# Patient Record
Sex: Male | Born: 1980 | Hispanic: Yes | Marital: Married | Smoking: Former smoker
Health system: Southern US, Community
[De-identification: ages and names within clinical notes are randomized; demographics above are authoritative.]

## PROBLEM LIST (undated history)

## (undated) HISTORY — PX: LEG SURGERY: SHX1003

## (undated) HISTORY — PX: APPENDECTOMY: SHX54

---

## 2005-02-01 ENCOUNTER — Emergency Department: Payer: Self-pay | Admitting: Emergency Medicine

## 2005-12-11 ENCOUNTER — Emergency Department: Payer: Self-pay | Admitting: Emergency Medicine

## 2005-12-25 ENCOUNTER — Emergency Department: Payer: Self-pay | Admitting: Emergency Medicine

## 2009-07-07 ENCOUNTER — Inpatient Hospital Stay: Payer: Self-pay | Admitting: Surgery

## 2009-09-19 ENCOUNTER — Emergency Department: Payer: Self-pay

## 2011-11-03 ENCOUNTER — Ambulatory Visit: Payer: Self-pay | Admitting: Family Medicine

## 2011-11-03 ENCOUNTER — Ambulatory Visit: Payer: Self-pay

## 2011-11-03 VITALS — BP 118/77 | HR 67 | Temp 98.0°F | Resp 16

## 2011-11-03 DIAGNOSIS — M79672 Pain in left foot: Secondary | ICD-10-CM

## 2011-11-03 DIAGNOSIS — S92309A Fracture of unspecified metatarsal bone(s), unspecified foot, initial encounter for closed fracture: Secondary | ICD-10-CM

## 2011-11-03 DIAGNOSIS — M79609 Pain in unspecified limb: Secondary | ICD-10-CM

## 2011-11-03 MED ORDER — HYDROCODONE-ACETAMINOPHEN 7.5-750 MG PO TABS: 1.0000 | ORAL_TABLET | Freq: Three times a day (TID) | ORAL | Status: AC | PRN

## 2011-11-03 NOTE — Progress Notes (Signed)
  Subjective:    Patient ID: Ames Coupe, male    DOB: 1980-11-13, 31 y.o.   MRN: [id number]  HPI 31 yo male jumped off porch and landed on both feet, but under left foot was small lump of some nature - uneven ground - and bent foot up.  Immediate pain and swelling.  Has not been able to bear weight on it.    Review of Systems Negative except as per HPI     Objective:   Physical Exam  Left foot tender across top of midfoot.  Pain at bilateral malleoli.  Swelling.  Decreased ROM.  Warm, neurovascularly intact  [facility] Primary radiology reading by Dr. Georgiana Shore: fracture of base of 2nd, 3rd, and 4th metatarsals with fx of adjoining tarsal.        Assessment & Plan:  Foot pain Fractures  nonweight bearing, boot, crutches, to ortho in AM

## 2015-12-04 ENCOUNTER — Emergency Department (HOSPITAL_BASED_OUTPATIENT_CLINIC_OR_DEPARTMENT_OTHER): Payer: Self-pay

## 2015-12-04 ENCOUNTER — Encounter (HOSPITAL_BASED_OUTPATIENT_CLINIC_OR_DEPARTMENT_OTHER): Payer: Self-pay | Admitting: *Deleted

## 2015-12-04 ENCOUNTER — Emergency Department (HOSPITAL_BASED_OUTPATIENT_CLINIC_OR_DEPARTMENT_OTHER)
Admission: EM | Admit: 2015-12-04 | Discharge: 2015-12-04 | Disposition: A | Discharge status: Home / Self Care | Payer: Self-pay | Attending: Emergency Medicine | Admitting: Emergency Medicine

## 2015-12-04 DIAGNOSIS — Y9289 Other specified places as the place of occurrence of the external cause: Secondary | ICD-10-CM | POA: Insufficient documentation

## 2015-12-04 DIAGNOSIS — Y9389 Activity, other specified: Secondary | ICD-10-CM | POA: Insufficient documentation

## 2015-12-04 DIAGNOSIS — W450XXA Nail entering through skin, initial encounter: Secondary | ICD-10-CM | POA: Insufficient documentation

## 2015-12-04 DIAGNOSIS — Z23 Encounter for immunization: Secondary | ICD-10-CM | POA: Insufficient documentation

## 2015-12-04 DIAGNOSIS — F17210 Nicotine dependence, cigarettes, uncomplicated: Secondary | ICD-10-CM | POA: Insufficient documentation

## 2015-12-04 DIAGNOSIS — T148XXA Other injury of unspecified body region, initial encounter: Secondary | ICD-10-CM

## 2015-12-04 DIAGNOSIS — Y990 Civilian activity done for income or pay: Secondary | ICD-10-CM | POA: Insufficient documentation

## 2015-12-04 DIAGNOSIS — S61431A Puncture wound without foreign body of right hand, initial encounter: Secondary | ICD-10-CM | POA: Insufficient documentation

## 2015-12-04 MED ORDER — TETANUS-DIPHTH-ACELL PERTUSSIS 5-2.5-18.5 LF-MCG/0.5 IM SUSP
0.5000 mL | Freq: Once | INTRAMUSCULAR | Status: AC
  Administered 2015-12-04: 0.5 mL via INTRAMUSCULAR
  Filled 2015-12-04: qty 0.5

## 2015-12-04 MED ORDER — AMOXICILLIN-POT CLAVULANATE 875-125 MG PO TABS: 1.0000 | ORAL_TABLET | Freq: Two times a day (BID) | ORAL | Status: DC

## 2015-12-04 MED ORDER — AMOXICILLIN-POT CLAVULANATE 875-125 MG PO TABS
1.0000 | ORAL_TABLET | Freq: Once | ORAL | Status: AC
  Administered 2015-12-04: 1 via ORAL
  Filled 2015-12-04: qty 1

## 2015-12-04 NOTE — ED Provider Notes (Signed)
CSN: [id number]     Arrival date & time 12/04/15  1908 History  By signing my name below, I, Jolayne Panther, attest that this documentation has been prepared under the direction and in the presence of Dorie Rank, MD. Electronically Signed: Jolayne Panther, Scribe. 12/04/2015. 8:25 PM.    Chief Complaint  Patient presents with  . Hand Injury   The history is provided by the patient. No language interpreter was used.    HPI Comments: Edmundo Lonsdale is a 35 y.o. male who presents to the Emergency Department complaining of sudden onset of a wound to his right hand with the site of entry on the dorsal aspect and the exit wound on the palmar aspect of his hand, after pt shot a framing nail through his right hand about three hours ago. Pt states that the nail came out easily when taken out immediately following the injury Pt's tetanus is not UTD. Bleeding is currently controlled.  History reviewed. No pertinent past medical history. Past Surgical History  Procedure Laterality Date  . Appendectomy    . Leg surgery     No family history on file. Social History  Substance Use Topics  . Smoking status: Current Every Day Smoker    Types: Cigarettes  . Smokeless tobacco: None  . Alcohol Use: Yes    Review of Systems  Skin: Positive for wound.  A complete 10 system review of systems was obtained and all systems are negative except as noted in the HPI and PMH.   Allergies  Review of patient's allergies indicates no known allergies.  Home Medications   Prior to Admission medications   Medication Sig Start Date End Date Taking? Authorizing Provider  amoxicillin-clavulanate (AUGMENTIN) 875-125 MG tablet Take 1 tablet by mouth 2 (two) times daily. 12/04/15   Dorie Rank, MD   BP 131/84 mmHg  Pulse 84  Temp(Src) 97.8 F (36.6 C) (Oral)  Resp 20  Ht 5\' 2"  (1.575 m)  Wt 63.504 kg  BMI 25.60 kg/m2  SpO2 100% Physical Exam  Constitutional: He appears well-developed and well-nourished.  No distress.  HENT:  Head: Normocephalic and atraumatic.  Right Ear: External ear normal.  Left Ear: External ear normal.  Eyes: Conjunctivae are normal. Right eye exhibits no discharge. Left eye exhibits no discharge. No scleral icterus.  Neck: Neck supple. No tracheal deviation present.  Cardiovascular: Normal rate.   Pulmonary/Chest: Effort normal. No stridor. No respiratory distress.  Musculoskeletal: He exhibits no edema.  Puncture wound on dorsal aspect around the fourth MCP joint and another wound on the palmar aspect more in the web space between the thumb and index finger.  Neurological: He is alert. Cranial nerve deficit: no gross deficits.  Skin: Skin is warm and dry. No rash noted.  Psychiatric: He has a normal mood and affect.  Nursing note and vitals reviewed.  ED Course  Procedures  DIAGNOSTIC STUDIES:    Oxygen Saturation is 100% on RA, normal by my interpretation.   COORDINATION OF CARE:  8:18 PM Will order x-ray and tetanus shot. Discussed treatment plan with pt at bedside and pt agreed to plan.   Imaging Review Dg Hand Complete Right  12/04/2015  CLINICAL DATA:  Puncture with a nail. EXAM: RIGHT HAND - COMPLETE 3+ VIEW COMPARISON:  None. FINDINGS: There is no evidence of fracture or dislocation. There is no evidence of arthropathy or other focal bone abnormality. Soft tissues are unremarkable. IMPRESSION: Negative. Electronically Signed   By: Nolon Nations  M.D.   On: 12/04/2015 20:05   I have personally reviewed and evaluated these images as part of my medical decision-making.  MDM   Final diagnoses:  Puncture wound   Wound cleaned with normal saline.  No fx noted on xray.  Will dc home with oral abx.  Tetanus updated  I personally performed the services described in this documentation, which was scribed in my presence.  The recorded information has been reviewed and is accurate.      Dorie Rank, MD 12/04/15 2220

## 2015-12-04 NOTE — ED Notes (Signed)
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 Dried blood noted. Bleeding controlled. Entrance wound to the back of his hand and exit on the palm side of his hand.

## 2015-12-04 NOTE — Discharge Instructions (Signed)
Puncture Wound A puncture wound is an injury that is caused by a sharp, thin object that goes through (penetrates) your skin. Usually, a puncture wound does not leave a large opening in your skin, so it may not bleed a lot. However, when you get a puncture wound, dirt or other materials (foreign bodies) can be forced into your wound and break off inside. This increases the chance of infection, such as tetanus. CAUSES Puncture wounds are caused by any sharp, thin object that goes through your skin, such as:  Animal teeth, as with an animal bite.  Sharp, pointed objects, such as nails, splinters of glass, fishhooks, and needles. SYMPTOMS Symptoms of a puncture wound include:  Pain.  Bleeding.  Swelling.  Bruising.  Fluid leaking from the wound.  Numbness, tingling, or loss of function. DIAGNOSIS This condition is diagnosed with a medical history and physical exam. Your wound will be checked to see if it contains any foreign bodies. You may also have X-rays or other imaging tests. TREATMENT Treatment for a puncture wound depends on how serious the wound is. It also depends on whether the wound contains any foreign bodies. Treatment for all types of puncture wounds usually starts with:  Controlling the bleeding.  Washing out the wound with a germ-free (sterile) salt-water solution.  Checking the wound for foreign bodies. Treatment may also include:  Having the wound opened surgically to remove a foreign object.  Closing the wound with stitches (sutures) if it continues to bleed.  Covering the wound with antibiotic ointments and a bandage (dressing).  Receiving a tetanus shot.  Receiving a rabies vaccine. HOME CARE INSTRUCTIONS Medicines  Take or apply over-the-counter and prescription medicines only as told by your health care provider.  If you were prescribed an antibiotic, take or apply it as told by your health care provider. Do not stop using the antibiotic even if  your condition improves. Wound Care  There are many ways to close and cover a wound. For example, a wound can be covered with sutures, skin glue, or adhesive strips. Follow instructions from your health care provider about:  How to take care of your wound.  When and how you should change your dressing.  When you should remove your dressing.  Removing whatever was used to close your wound.  Keep the dressing dry as told by your health care provider. Do not take baths, swim, use a hot tub, or do anything that would put your wound underwater until your health care provider approves.  Clean the wound as told by your health care provider.  Do not scratch or pick at the wound.  Check your wound every day for signs of infection. Watch for:  Redness, swelling, or pain.  Fluid, blood, or pus. General Instructions  Raise (elevate) the injured area above the level of your heart while you are sitting or lying down.  If your puncture wound is in your foot, ask your health care provider if you need to avoid putting weight on your foot and for how long.  Keep all follow-up visits as told by your health care provider. This is important. SEEK MEDICAL CARE IF:  You received a tetanus shot and you have swelling, severe pain, redness, or bleeding at the injection site.  You have a fever.  Your sutures come out.  You notice a bad smell coming from your wound or your dressing.  You notice something coming out of your wound, such as wood or glass.  Your   pain is not controlled with medicine.  You have increased redness, swelling, or pain at the site of your wound.  You have fluid, blood, or pus coming from your wound.  You notice a change in the color of your skin near your wound.  You need to change the dressing frequently due to fluid, blood, or pus draining from your wound.  You develop a new rash.  You develop numbness around your wound. SEEK IMMEDIATE MEDICAL CARE IF:  You  develop severe swelling around your wound.  Your pain suddenly increases and is severe.  You develop painful skin lumps.  You have a red streak going away from your wound.  The wound is on your hand or foot and you cannot properly move a finger or toe.  The wound is on your hand or foot and you notice that your fingers or toes look pale or bluish.   This information is not intended to replace advice given to you by your health care provider. Make sure you discuss any questions you have with your health care provider.   Document Released: 06/23/2005 Document Revised: 06/04/2015 Document Reviewed: 11/06/2014 Elsevier Interactive Patient Education 2016 [employer]  

## 2017-05-01 ENCOUNTER — Emergency Department
Admission: EM | Admit: 2017-05-01 | Discharge: 2017-05-02 | Disposition: A | Discharge status: Home / Self Care | Payer: Self-pay | Attending: Emergency Medicine | Admitting: Emergency Medicine

## 2017-05-01 DIAGNOSIS — Z79899 Other long term (current) drug therapy: Secondary | ICD-10-CM | POA: Insufficient documentation

## 2017-05-01 DIAGNOSIS — K4090 Unilateral inguinal hernia, without obstruction or gangrene, not specified as recurrent: Secondary | ICD-10-CM | POA: Insufficient documentation

## 2017-05-01 DIAGNOSIS — Z87891 Personal history of nicotine dependence: Secondary | ICD-10-CM | POA: Insufficient documentation

## 2017-05-01 LAB — CBC
HCT: 48.2 % (ref 40.0–52.0)
Hemoglobin: 16.1 g/dL (ref 13.0–18.0)
MCH: 28 pg (ref 26.0–34.0)
MCHC: 33.3 g/dL (ref 32.0–36.0)
MCV: 84.2 fL (ref 80.0–100.0)
PLATELETS: 221 10*3/uL (ref 150–440)
RBC: 5.73 MIL/uL (ref 4.40–5.90)
RDW: 14.1 % (ref 11.5–14.5)
WBC: 10.7 10*3/uL — ABNORMAL HIGH (ref 3.8–10.6)

## 2017-05-01 LAB — BASIC METABOLIC PANEL
Anion gap: 8 (ref 5–15)
BUN: 12 mg/dL (ref 6–20)
CHLORIDE: 105 mmol/L (ref 101–111)
CO2: 25 mmol/L (ref 22–32)
CREATININE: 0.82 mg/dL (ref 0.61–1.24)
Calcium: 9.1 mg/dL (ref 8.9–10.3)
GFR calc non Af Amer: >60 mL/min (ref >60)
GLUCOSE: 105 mg/dL — AB (ref 65–99)
Potassium: 3.4 mmol/L — ABNORMAL LOW (ref 3.5–5.1)
Sodium: 138 mmol/L (ref 135–145)

## 2017-05-01 LAB — URINALYSIS, COMPLETE (UACMP) WITH MICROSCOPIC
BACTERIA UA: NONE SEEN
BILIRUBIN URINE: NEGATIVE
GLUCOSE, UA: NEGATIVE mg/dL
HGB URINE DIPSTICK: NEGATIVE
Ketones, ur: NEGATIVE mg/dL
Leukocytes, UA: NEGATIVE
NITRITE: NEGATIVE
PH: 6 (ref 5.0–8.0)
Protein, ur: NEGATIVE mg/dL
SPECIFIC GRAVITY, URINE: 1.004 — AB (ref 1.005–1.030)
Squamous Epithelial / LPF: NONE SEEN

## 2017-05-01 NOTE — ED Triage Notes (Signed)
Patient c/o right flank pain and nausea  X 2 months with increasing severity the last few days.

## 2017-05-01 NOTE — ED Triage Notes (Signed)
Patient also reports increased stress due to his son having surgery.

## 2017-05-02 MED ORDER — IBUPROFEN 600 MG PO TABS: 600.0000 mg | ORAL_TABLET | Freq: Three times a day (TID) | ORAL | 0 refills | Status: DC | PRN

## 2017-05-02 NOTE — Discharge Instructions (Signed)
Please take your pain medication as needed for your discomfort and make an appointment to follow-up with Gen. surgery in the near future for reevaluation and possible repair of your hernia. Return to the emergency department for any concerns.  It was a pleasure to take care of you today, and thank you for coming to our emergency department.  If you have any questions or concerns before leaving please ask the nurse to grab me and I'm more than happy to go through your aftercare instructions again.  If you were prescribed any opioid pain medication today such as Norco, Vicodin, Percocet, morphine, hydrocodone, or oxycodone please make sure you do not drive when you are taking this medication as it can alter your ability to drive safely.  If you have any concerns once you are home that you are not improving or are in fact getting worse before you can make it to your follow-up appointment, please do not hesitate to call 911 and come back for further evaluation.  Darel Hong, MD  Results for orders placed or performed during the hospital encounter of 05/01/17  Urinalysis, Complete w Microscopic  Result Value Ref Range   Color, Urine STRAW (A) YELLOW   APPearance CLEAR (A) CLEAR   Specific Gravity, Urine 1.004 (L) 1.005 - 1.030   pH 6.0 5.0 - 8.0   Glucose, UA NEGATIVE NEGATIVE mg/dL   Hgb urine dipstick NEGATIVE NEGATIVE   Bilirubin Urine NEGATIVE NEGATIVE   Ketones, ur NEGATIVE NEGATIVE mg/dL   Protein, ur NEGATIVE NEGATIVE mg/dL   Nitrite NEGATIVE NEGATIVE   Leukocytes, UA NEGATIVE NEGATIVE   RBC / HPF 0-5 0 - 5 RBC/hpf   WBC, UA 0-5 0 - 5 WBC/hpf   Bacteria, UA NONE SEEN NONE SEEN   Squamous Epithelial / LPF NONE SEEN NONE SEEN  Basic metabolic panel  Result Value Ref Range   Sodium 138 135 - 145 mmol/L   Potassium 3.4 (L) 3.5 - 5.1 mmol/L   Chloride 105 101 - 111 mmol/L   CO2 25 22 - 32 mmol/L   Glucose, Bld 105 (H) 65 - 99 mg/dL   BUN 12 6 - 20 mg/dL   Creatinine, Ser 0.82 0.61  - 1.24 mg/dL   Calcium 9.1 8.9 - 10.3 mg/dL   GFR calc non Af Amer >60 >60 mL/min   GFR calc Af Amer >60 >60 mL/min   Anion gap 8 5 - 15  CBC  Result Value Ref Range   WBC 10.7 (H) 3.8 - 10.6 K/uL   RBC 5.73 4.40 - 5.90 MIL/uL   Hemoglobin 16.1 13.0 - 18.0 g/dL   HCT 48.2 40.0 - 52.0 %   MCV 84.2 80.0 - 100.0 fL   MCH 28.0 26.0 - 34.0 pg   MCHC 33.3 32.0 - 36.0 g/dL   RDW 14.1 11.5 - 14.5 %   Platelets 221 150 - 440 K/uL

## 2017-05-02 NOTE — ED Provider Notes (Signed)
[facility] Emergency Department Provider Note  ____________________________________________   First MD Initiated Contact with Patient 05/02/17 0006     (approximate)  I have reviewed the triage vital signs and the nursing notes.   HISTORY  Chief Complaint Flank Pain (right)   HPI Kenneth Dennis is a 36 y.o. male who self presents to the emergency Department with roughly 2 months of right low back right lower quadrant and right groin discomfort. The pain became acutely worse today which prompted the visit. He has not sought medical care until today. The pain is aching cramping and discomfort. He works in [employer] and is particularly worse when lifting heavy objects. When he lifts things he feels like there is a bulge in his right groin. He has no hematuria dysuria frequency or hesitancy. He has a remote surgical history of appendectomy. He declines pain medication at this time. Denies nausea and vomiting.   History reviewed. No pertinent past medical history.  There are no active problems to display for this patient.   Past Surgical History:  Procedure Laterality Date  . APPENDECTOMY    . LEG SURGERY      Prior to Admission medications   Medication Sig Start Date End Date Taking? Authorizing Provider  amoxicillin-clavulanate (AUGMENTIN) 875-125 MG tablet Take 1 tablet by mouth 2 (two) times daily. 12/04/15   Dorie Rank, MD  ibuprofen (ADVIL,MOTRIN) 600 MG tablet Take 1 tablet (600 mg total) by mouth every 8 (eight) hours as needed. 05/02/17   Darel Hong, MD    Allergies Patient has no known allergies.  No family history on file.  Social History Social History  Substance Use Topics  . Smoking status: Former Smoker    Types: Cigarettes  . Smokeless tobacco: Never Used  . Alcohol use Yes    Review of Systems Constitutional: No fever/chills Eyes: No visual changes. ENT: No sore throat. Cardiovascular: Denies chest  pain. Respiratory: Denies shortness of breath. Gastrointestinal: Positive abdominal pain.  No nausea, no vomiting.  No diarrhea.  No constipation. Genitourinary: Negative for dysuria. Musculoskeletal: Positive for back pain. Skin: Negative for rash. Neurological: Negative for headaches, focal weakness or numbness.   ____________________________________________   PHYSICAL EXAM:  VITAL SIGNS: ED Triage Vitals  Enc Vitals Group     BP 05/01/17 1940 106/70     Pulse Rate 05/01/17 1940 71     Resp 05/01/17 1940 15     Temp 05/01/17 1940 98.8 F (37.1 C)     Temp Source 05/01/17 1940 Oral     SpO2 05/01/17 1940 99 %     Weight 05/01/17 1941 140 lb (63.5 kg)     Height --      Head Circumference --      Peak Flow --      Pain Score 05/01/17 1940 5     Pain Loc --      Pain Edu? --      Excl. in [place]? --     Constitutional: Alert and oriented 4 very well-appearing nontoxic no diaphoresis speaks in full clear sentences Eyes: PERRL EOMI. Head: Atraumatic. Nose: No congestion/rhinnorhea. Mouth/Throat: No trismus Neck: No stridor.   Cardiovascular: Normal rate, regular rhythm. Grossly normal heart sounds.  Good peripheral circulation. Respiratory: Normal respiratory effort.  No retractions. Lungs CTAB and moving good air Gastrointestinal: Soft nondistended nontender no rebound or guarding no peritonitis well-healed laparoscopic appendectomy scar Right inguinal hernia appreciated Musculoskeletal: No lower extremity edema   Neurologic:  Normal  speech and language. No gross focal neurologic deficits are appreciated. Skin:  Skin is warm, dry and intact. No rash noted. Psychiatric: Mood and affect are normal. Speech and behavior are normal.    ____________________________________________   DIFFERENTIAL includes but not limited to  Renal colic, pyelonephritis, stump appendicitis, inguinal hernia, musculoskeletal pain ____________________________________________   LABS (all  labs ordered are listed, but only abnormal results are displayed)  Labs Reviewed  URINALYSIS, COMPLETE (UACMP) WITH MICROSCOPIC - Abnormal; Notable for the following:       Result Value   Color, Urine STRAW (*)    APPearance CLEAR (*)    Specific Gravity, Urine 1.004 (*)    All other components within normal limits  BASIC METABOLIC PANEL - Abnormal; Notable for the following:    Potassium 3.4 (*)    Glucose, Bld 105 (*)    All other components within normal limits  CBC - Abnormal; Notable for the following:    WBC 10.7 (*)    All other components within normal limits    Blood work without signs of acute disease __________________________________________  EKG   ____________________________________________  RADIOLOGY   ____________________________________________   PROCEDURES  Procedure(s) performed: no  Procedures  Critical Care performed: no  Observation: no ____________________________________________   INITIAL IMPRESSION / ASSESSMENT AND PLAN / ED COURSE  Pertinent labs & imaging results that were available during my care of the patient were reviewed by me and considered in my medical decision making (see chart for details).  The patient arrives very well-appearing with 2 months of right lower quadrant and right inguinal pain. He declines pain medication. His blood work is reassuring. His pain is acutely worse with lifting or straining and he does on my exam appear to have a right inguinal hernia. Unclear exactly if his pain is secondary to musculoskeletal versus inguinal hernia however I will treat him symptomatically with ibuprofen and refer him to general surgery for further evaluation. He is discharged home in improved condition.      ____________________________________________   FINAL CLINICAL IMPRESSION(S) / ED DIAGNOSES  Final diagnoses:  Unilateral inguinal hernia without obstruction or gangrene, recurrence not specified      NEW MEDICATIONS  STARTED DURING THIS VISIT:  New Prescriptions   IBUPROFEN (ADVIL,MOTRIN) 600 MG TABLET    Take 1 tablet (600 mg total) by mouth every 8 (eight) hours as needed.     Note:  This document was prepared using Dragon voice recognition software and may include unintentional dictation errors.     Darel Hong, MD 05/02/17 [phone]

## 2017-05-05 ENCOUNTER — Emergency Department
Admission: EM | Admit: 2017-05-05 | Discharge: 2017-05-05 | Disposition: A | Discharge status: Home / Self Care | Payer: Self-pay | Attending: Emergency Medicine | Admitting: Emergency Medicine

## 2017-05-05 ENCOUNTER — Encounter: Payer: Self-pay | Admitting: Emergency Medicine

## 2017-05-05 ENCOUNTER — Emergency Department: Payer: Self-pay

## 2017-05-05 DIAGNOSIS — F419 Anxiety disorder, unspecified: Secondary | ICD-10-CM | POA: Insufficient documentation

## 2017-05-05 DIAGNOSIS — R1010 Upper abdominal pain, unspecified: Secondary | ICD-10-CM | POA: Insufficient documentation

## 2017-05-05 DIAGNOSIS — Z87891 Personal history of nicotine dependence: Secondary | ICD-10-CM | POA: Insufficient documentation

## 2017-05-05 DIAGNOSIS — G4700 Insomnia, unspecified: Secondary | ICD-10-CM | POA: Insufficient documentation

## 2017-05-05 LAB — CBC
HCT: 48.6 % (ref 40.0–52.0)
Hemoglobin: 16.3 g/dL (ref 13.0–18.0)
MCH: 28.3 pg (ref 26.0–34.0)
MCHC: 33.6 g/dL (ref 32.0–36.0)
MCV: 84.2 fL (ref 80.0–100.0)
PLATELETS: 232 10*3/uL (ref 150–440)
RBC: 5.77 MIL/uL (ref 4.40–5.90)
RDW: 14.3 % (ref 11.5–14.5)
WBC: 8.1 10*3/uL (ref 3.8–10.6)

## 2017-05-05 LAB — LIPASE, BLOOD: Lipase: 28 U/L (ref 11–51)

## 2017-05-05 LAB — COMPREHENSIVE METABOLIC PANEL
ALBUMIN: 5.2 g/dL — AB (ref 3.5–5.0)
ALK PHOS: 79 U/L (ref 38–126)
ALT: 27 U/L (ref 17–63)
AST: 21 U/L (ref 15–41)
Anion gap: 8 (ref 5–15)
BILIRUBIN TOTAL: 1 mg/dL (ref 0.3–1.2)
BUN: 13 mg/dL (ref 6–20)
CALCIUM: 9.4 mg/dL (ref 8.9–10.3)
CO2: 27 mmol/L (ref 22–32)
Chloride: 103 mmol/L (ref 101–111)
Creatinine, Ser: 0.83 mg/dL (ref 0.61–1.24)
GFR calc Af Amer: >60 mL/min (ref >60)
GLUCOSE: 126 mg/dL — AB (ref 65–99)
Potassium: 3.6 mmol/L (ref 3.5–5.1)
Sodium: 138 mmol/L (ref 135–145)
TOTAL PROTEIN: 8 g/dL (ref 6.5–8.1)

## 2017-05-05 NOTE — ED Notes (Signed)
Pt visualized in NAD at this time, resting in bed with family member at bedside watching TV. Respirations even and unlabored, skin warm, dry, and intact. Will continue to monitor for further patient needs.

## 2017-05-05 NOTE — Discharge Instructions (Signed)
You have been seen in the Emergency Department (ED) for abdominal pain.  Your evaluation did not identify a clear cause of your symptoms but was generally reassuring.  We feel your symptoms may be caused or contributed to by your anxiety and insomnia.  Please follow up as instructed above regarding today?s emergent visit and the symptoms that are bothering you.  Return to the ED if your abdominal pain worsens or fails to improve, you develop bloody vomiting, bloody diarrhea, you are unable to tolerate fluids due to vomiting, fever greater than 101, or other symptoms that concern you.

## 2017-05-05 NOTE — ED Triage Notes (Signed)
Per interpreter on the stick pt presents with low back pain and left sided chest pain started four weeks ago, went away and came back this am. Pt was sent over from [facility] for further eval.   Interpreter [phone]- Mickel Baas

## 2017-05-05 NOTE — ED Provider Notes (Signed)
[facility] Emergency Department Provider Note  ____________________________________________   First MD Initiated Contact with Patient 05/05/17 1642     (approximate)  I have reviewed the triage vital signs and the nursing notes.   HISTORY  Chief Complaint Abdominal Pain  The patient and/or family speak(s) Spanish.  They understand they have the right to the use of a hospital interpreter, however at this time they prefer to speak directly with me in [place].  They know that they can ask for an interpreter at any time.   HPI Kenneth Dennis is a 36 y.o. male with a PMH only significant for an appendectomy and prior leg surgery who presents for evaluationof persistent pain in his abdomen, primarily the upper abdomen, this seems to be associated with eating.  He reports no nausea nor vomiting but that he frequently has pain after eating.  Sometimes this pain radiates through from the upper abdomen to his back and sometimes on the left side of his chest.  pain has been going on for months but has been worse recently.  He was seen in this emergency department 3 days ago for similar symptoms but was also complaining at that time of right groin pain which seems to be secondary to a small inguinal hernia.  He states that that issue is not bothering him but that the pain in his upper abdomen is worse than it was previously although currently it is gone.  He denies fever/chills, shortness of breath, nausea/vomiting/diarrhea, dysuria.  He describes it as severe at the worst and nothing in particular makes it better.   History reviewed. No pertinent past medical history.  There are no active problems to display for this patient.   Past Surgical History:  Procedure Laterality Date  . APPENDECTOMY    . LEG SURGERY      Prior to Admission medications   Medication Sig Start Date End Date Taking? Authorizing Provider  amoxicillin-clavulanate (AUGMENTIN) 875-125 MG  tablet Take 1 tablet by mouth 2 (two) times daily. 12/04/15   Dorie Rank, MD  ibuprofen (ADVIL,MOTRIN) 600 MG tablet Take 1 tablet (600 mg total) by mouth every 8 (eight) hours as needed. 05/02/17   Darel Hong, MD    Allergies Patient has no known allergies.  No family history on file.  Social History Social History  Substance Use Topics  . Smoking status: Former Smoker    Types: Cigarettes  . Smokeless tobacco: Never Used  . Alcohol use Yes    Review of Systems Constitutional: No fever/chills Eyes: No visual changes. ENT: No sore throat. Cardiovascular: intermittent chest pain on the left side for months associated with the pain in his upper abdomen Respiratory: Denies shortness of breath. Gastrointestinal: episodic upper abdominal pain for months radiating through to his back, associated with eating Genitourinary: Negative for dysuria. Musculoskeletal: Negative for neck pain.  Negative for back pain. Integumentary: Negative for rash. Neurological: Negative for headaches, focal weakness or numbness.   ____________________________________________   PHYSICAL EXAM:  VITAL SIGNS: ED Triage Vitals  Enc Vitals Group     BP 05/05/17 1408 112/83     Pulse Rate 05/05/17 1408 62     Resp 05/05/17 1408 20     Temp 05/05/17 1408 97.9 F (36.6 C)     Temp Source 05/05/17 1408 Oral     SpO2 05/05/17 1408 100 %     Weight 05/05/17 1409 63.5 kg (140 lb)     Height --  Head Circumference --      Peak Flow --      Pain Score --      Pain Loc --      Pain Edu? --      Excl. in [place]? --     Constitutional: Alert and oriented. Well appearing and in no acute distress. Eyes: Conjunctivae are normal.  Head: Atraumatic. Nose: No congestion/rhinnorhea. Mouth/Throat: Mucous membranes are moist. Neck: No stridor.  No meningeal signs.   Cardiovascular: Normal rate, regular rhythm. Good peripheral circulation. Grossly normal heart sounds. Respiratory: Normal respiratory effort.   No retractions. Lungs CTAB. Gastrointestinal: Soft with mild tenderness to palpation of the epigastrium, no tenderness to palpation of the lower abdomen.  negative Murphy's sign Musculoskeletal: No lower extremity tenderness nor edema. No gross deformities of extremities. Neurologic:  Normal speech and language. No gross focal neurologic deficits are appreciated.  Skin:  Skin is warm, dry and intact. No rash noted. Psychiatric: Mood and affect are normal. Speech and behavior are normal.  ____________________________________________   LABS (all labs ordered are listed, but only abnormal results are displayed)  Labs Reviewed  COMPREHENSIVE METABOLIC PANEL - Abnormal; Notable for the following:       Result Value   Glucose, Bld 126 (*)    Albumin 5.2 (*)    All other components within normal limits  LIPASE, BLOOD  CBC   ____________________________________________  EKG  None - EKG not ordered by ED physician ____________________________________________  RADIOLOGY   US Abdomen Limited Ruq  Result Date: 05/05/2017 CLINICAL DATA:  Right upper quadrant and flank pain with nausea after eating. History of appendectomy. EXAM: ULTRASOUND ABDOMEN LIMITED RIGHT UPPER QUADRANT COMPARISON:  None. FINDINGS: Gallbladder: No gallstones or wall thickening visualized. No sonographic Murphy sign noted by sonographer. Common bile duct: Diameter: Normal at 2.6 mm Liver: No focal lesion identified. Slight increase in hepatic echogenicity which may reflect mild fatty infiltration. IMPRESSION: Unremarkable appearance of the gallbladder. Slightly echogenic appearance of the liver parenchyma which may reflect fatty infiltration. Electronically Signed   By: Ashley Royalty M.D.   On: 05/05/2017 18:14    ____________________________________________   PROCEDURES  Critical Care performed: No   Procedure(s) performed:   Procedures   ____________________________________________   INITIAL IMPRESSION /  ASSESSMENT AND PLAN / ED COURSE  Pertinent labs & imaging results that were available during my care of the patient were reviewed by me and considered in my medical decision making (see chart for details).  the patient's signs and symptoms are most consistent with biliary colic.  His labs are all reassuring.  I will obtain an ultrasound and then discuss with him whether or not it is necessary to proceed with the CT scan if the ultrasound is negative but his vital signs are normaland I doubt that he is suffering from an acute or emergent medical condition at this time.  I will reassess after the ultrasound results are available.   Clinical Course as of May 05 1941  Thu May 05, 2017  1925 the patient continues to be asymptomatic.  His ultrasound was unremarkable.  I discussed those results as well as his normal lab work with him.  I offered a CT scan but explained that there is no evidence of an acute or emergent medical condition at this time and he should be appropriate follow-up with his primary care provider at [facility].  He states that he would prefer to do that.  After this discussion, he added that he  has felt very anxious recently and has been suing from insomnia for the last several days.  Everything seems to make him nervous and he occasionally has panic attacks.  He asked about medication that would help him with his insomnia but I explained to him that these are medications best prescribed by a primary care provider with whom he can follow-up regularly.  I also explained that I think that his constellation of symptoms is likely the result of his nerves and panic attacks rather than representing a different medical issue.  I encouraged him to follow up as soon as possible with his regular doctor.  He is comfortable with that plan as is his wife who is also now present.  I gave my usual and customary return precautions.     [CF]    Clinical Course User Index [CF] Hinda Kehr, MD     ____________________________________________  FINAL CLINICAL IMPRESSION(S) / ED DIAGNOSES  Final diagnoses:  Upper abdominal pain  Anxiety  Insomnia, unspecified type     MEDICATIONS GIVEN DURING THIS VISIT:  Medications - No data to display   NEW OUTPATIENT MEDICATIONS STARTED DURING THIS VISIT:  New Prescriptions   No medications on file    Modified Medications   No medications on file    Discontinued Medications   No medications on file     Note:  This document was prepared using Dragon voice recognition software and may include unintentional dictation errors.    Hinda Kehr, MD 05/05/17 [phone]

## 2017-05-05 NOTE — ED Notes (Signed)
Patient verbalizes understanding of d/c instructions and follow-up. VS stable and pain controlled per patient.  Patient in NAD at time of d/c and denies further concerns regarding this visit. Patient stable at the time of departure from the unit, departing unit by the safest and most appropriate manner per that patients condition and limitations. Patient advised to return to the ED at any time for emergent concerns, or for new/worsening symptoms.    Patient refused wheel chair out  

## 2017-05-05 NOTE — ED Notes (Signed)
Pt returned from US

## 2017-11-24 IMAGING — US US ABDOMEN LIMITED
1 series · 14 of 25 positions shown · non-contrast
Comparison: None.

CLINICAL DATA: Right upper quadrant and flank pain with nausea
after eating. History of appendectomy.

EXAM:
ULTRASOUND ABDOMEN LIMITED RIGHT UPPER QUADRANT

[Series 1: us abdomen limited · 0.20mm/px · 14 of 40 slices shown]
[im 1/40]
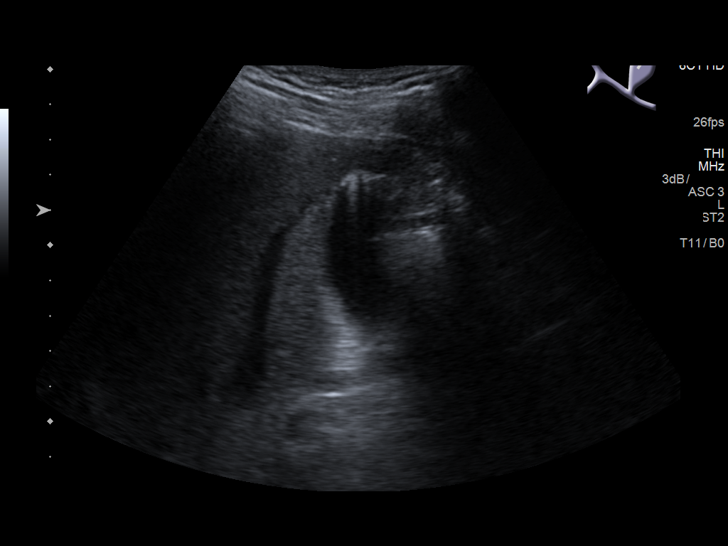
[im 4/40]
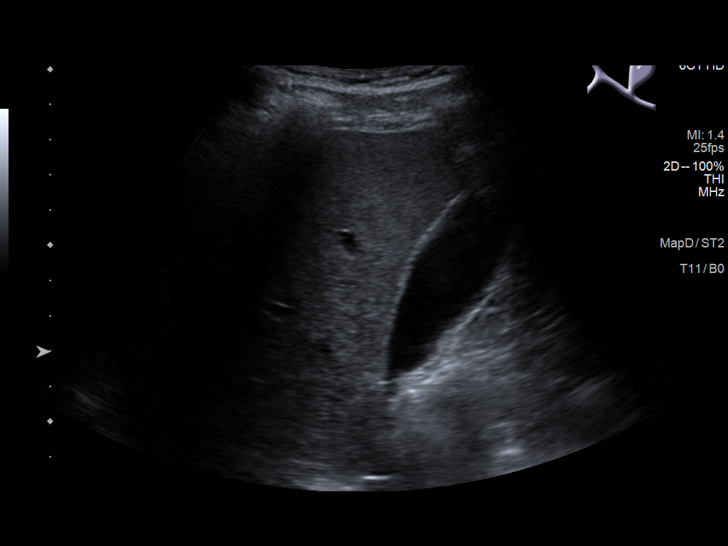
[im 7/40]
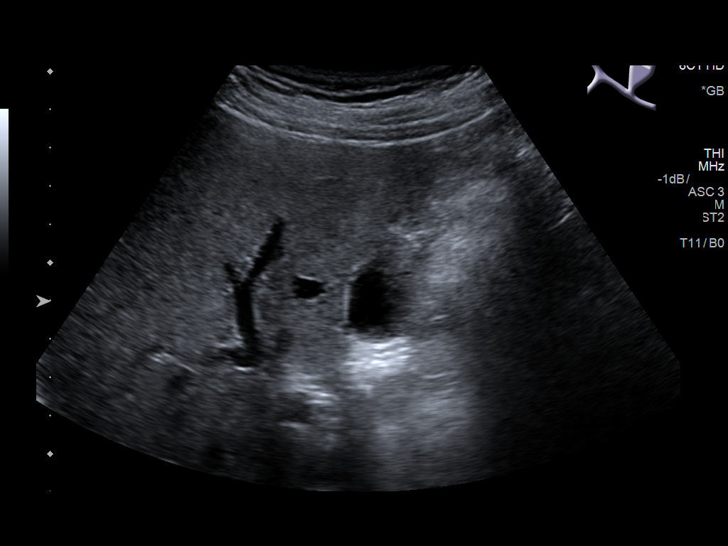
[im 10/40]
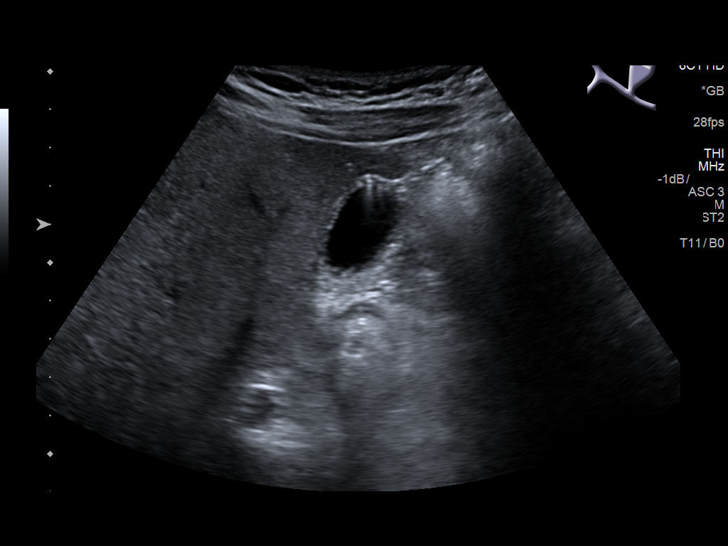
[im 14/40]
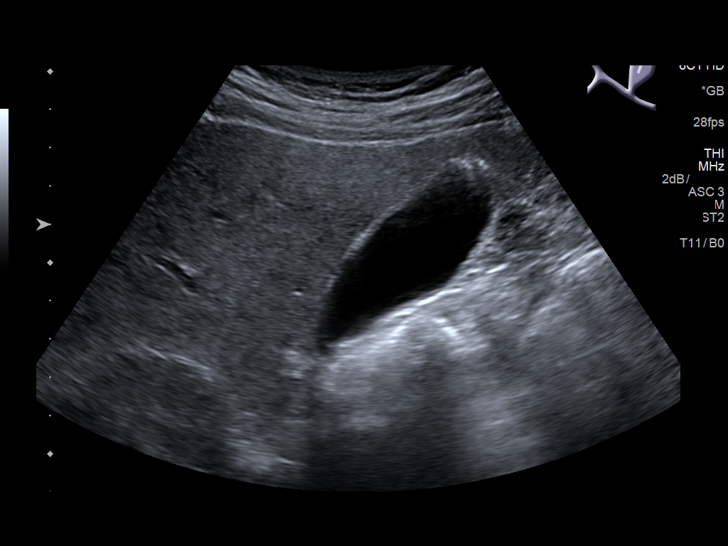
[im 15/40]
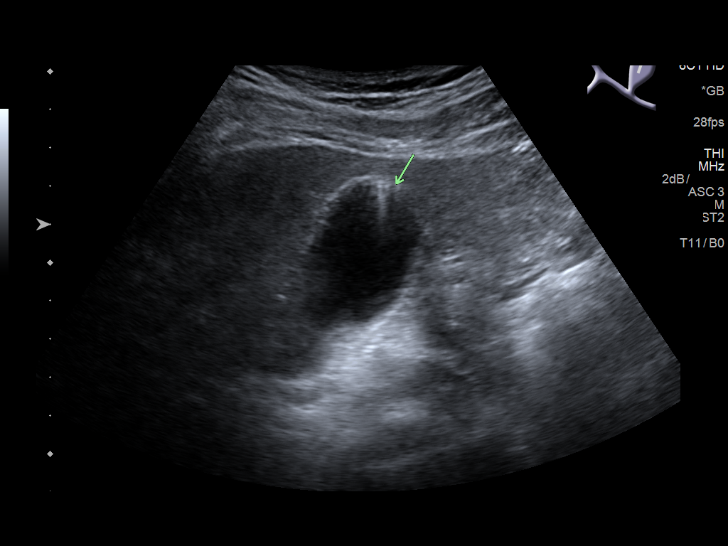
[im 18/40]
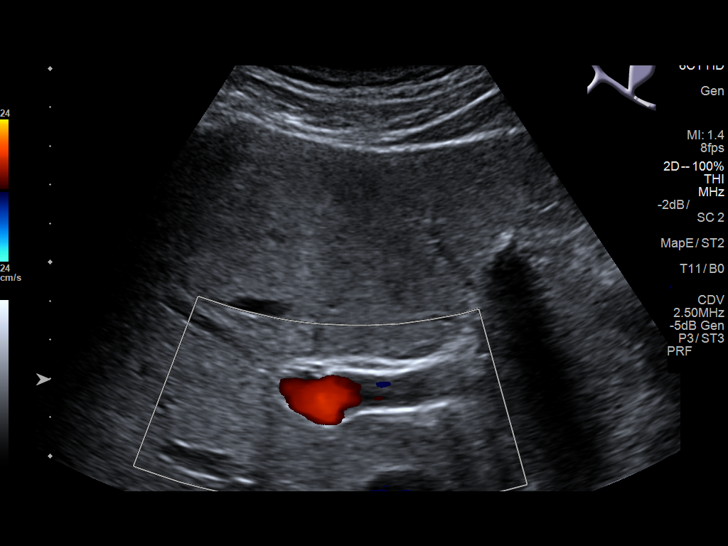
[im 22/40]
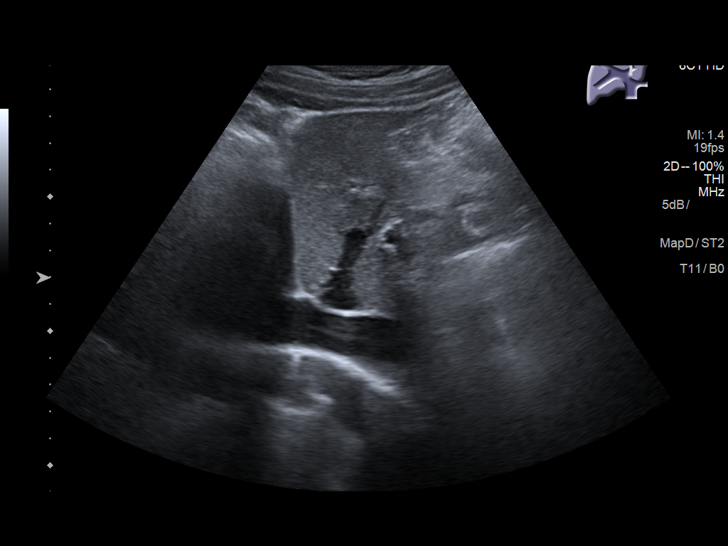
[im 25/40]
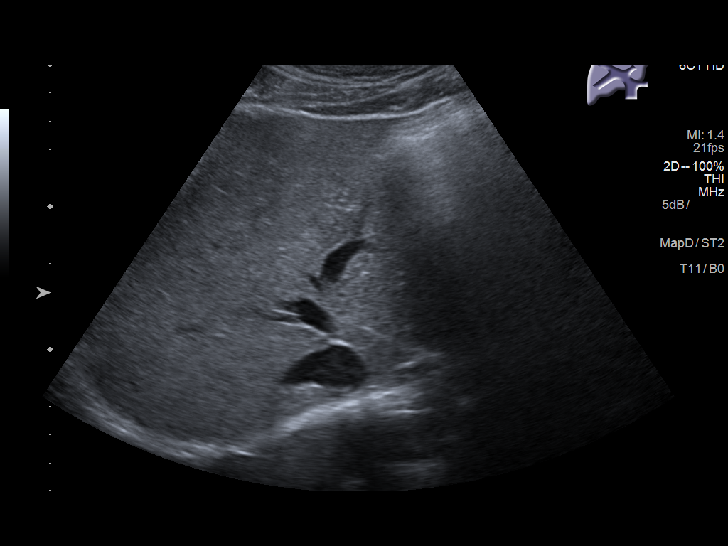
[im 27/40]
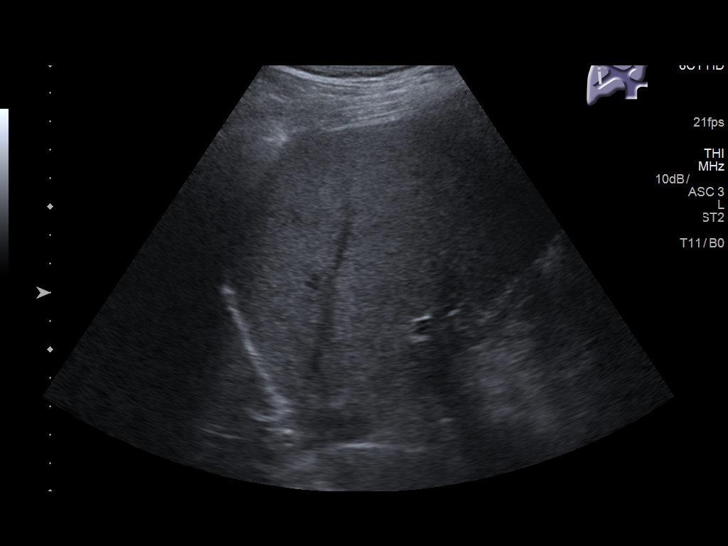
[im 30/40]
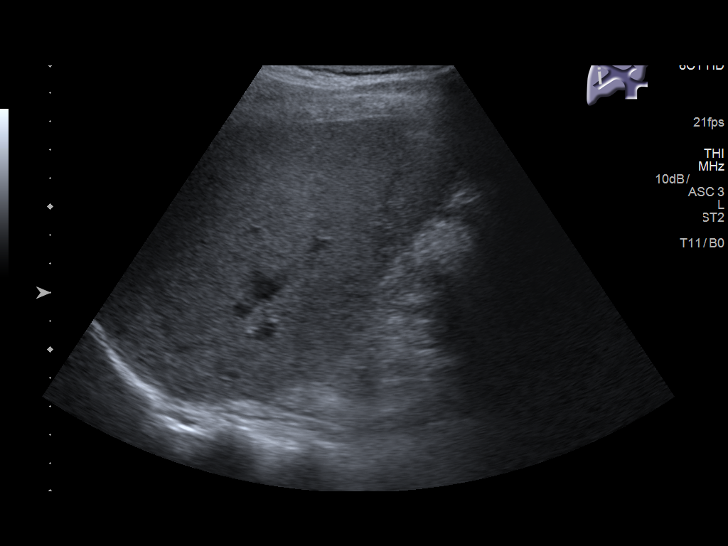
[im 33/40]
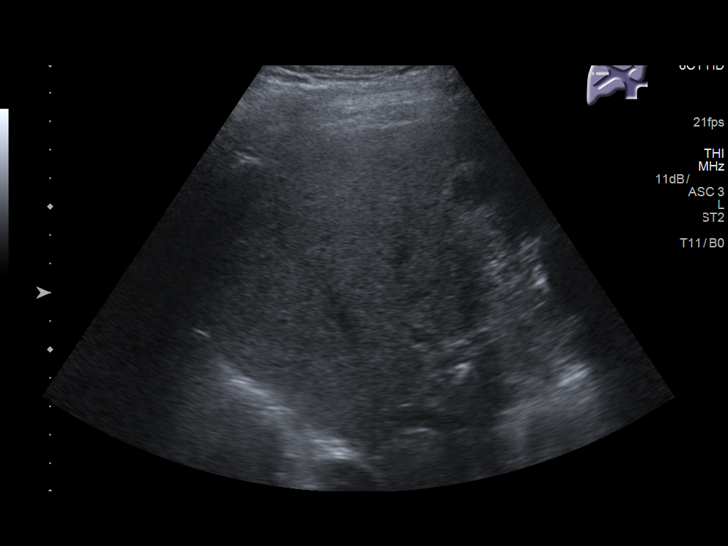
[im 36/40]
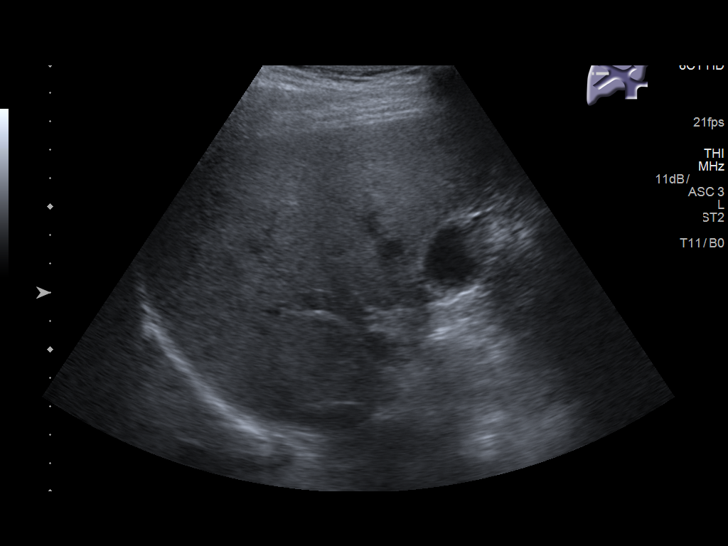
[im 40/40]
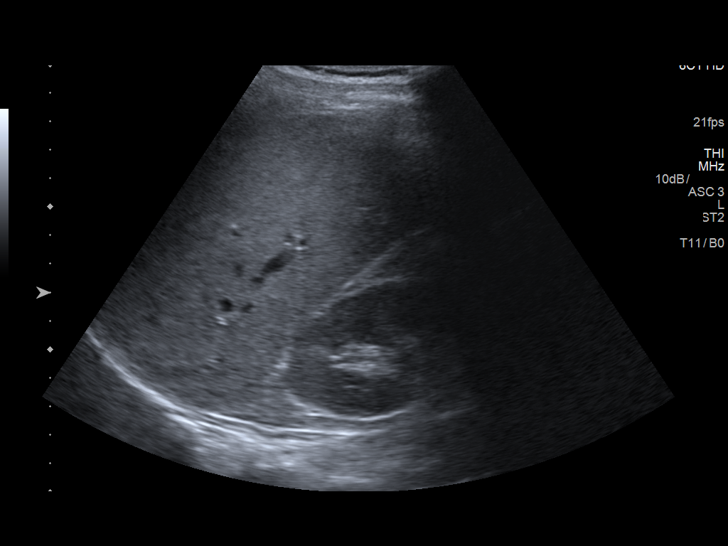

[14 of 25 positions shown; findings below may reference images not displayed]

FINDINGS: Gallbladder:

No gallstones or wall thickening visualized. No sonographic Murphy
sign noted by sonographer.

Common bile duct:

Diameter: Normal at 2.6 mm

Liver:

No focal lesion identified. Slight increase in hepatic echogenicity
which may reflect mild fatty infiltration.
IMPRESSION: Unremarkable appearance of the gallbladder. Slightly echogenic
appearance of the liver parenchyma which may reflect fatty
infiltration.

## 2019-05-12 ENCOUNTER — Other Ambulatory Visit: Payer: Self-pay | Admitting: Radiology

## 2019-05-12 DIAGNOSIS — Z20822 Contact with and (suspected) exposure to covid-19: Secondary | ICD-10-CM

## 2019-05-13 LAB — NOVEL CORONAVIRUS, NAA: SARS-CoV-2, NAA: DETECTED — AB

## 2022-05-05 ENCOUNTER — Emergency Department: Payer: Self-pay

## 2022-05-05 ENCOUNTER — Emergency Department
Admission: EM | Admit: 2022-05-05 | Discharge: 2022-05-06 | Disposition: A | Discharge status: Home / Self Care | Payer: Self-pay | Attending: Emergency Medicine | Admitting: Emergency Medicine

## 2022-05-05 DIAGNOSIS — D135 Benign neoplasm of extrahepatic bile ducts: Secondary | ICD-10-CM

## 2022-05-05 DIAGNOSIS — K828 Other specified diseases of gallbladder: Secondary | ICD-10-CM | POA: Insufficient documentation

## 2022-05-05 DIAGNOSIS — R1011 Right upper quadrant pain: Secondary | ICD-10-CM

## 2022-05-05 DIAGNOSIS — K760 Fatty (change of) liver, not elsewhere classified: Secondary | ICD-10-CM

## 2022-05-05 LAB — COMPREHENSIVE METABOLIC PANEL
ALT: 24 U/L (ref 0–44)
AST: 22 U/L (ref 15–41)
Albumin: 4.7 g/dL (ref 3.5–5.0)
Alkaline Phosphatase: 74 U/L (ref 38–126)
Anion gap: 9 (ref 5–15)
BUN: 10 mg/dL (ref 6–20)
CO2: 23 mmol/L (ref 22–32)
Calcium: 9 mg/dL (ref 8.9–10.3)
Chloride: 108 mmol/L (ref 98–111)
Creatinine, Ser: 0.78 mg/dL (ref 0.61–1.24)
GFR, Estimated: >60 mL/min (ref >60)
Glucose, Bld: 101 mg/dL — ABNORMAL HIGH (ref 70–99)
Potassium: 4 mmol/L (ref 3.5–5.1)
Sodium: 140 mmol/L (ref 135–145)
Total Bilirubin: 0.8 mg/dL (ref 0.3–1.2)
Total Protein: 7.5 g/dL (ref 6.5–8.1)

## 2022-05-05 LAB — GASTROINTESTINAL PANEL BY PCR, STOOL (REPLACES STOOL CULTURE)

## 2022-05-05 LAB — URINALYSIS, ROUTINE W REFLEX MICROSCOPIC
Bilirubin Urine: NEGATIVE
Glucose, UA: NEGATIVE mg/dL
Hgb urine dipstick: NEGATIVE
Ketones, ur: NEGATIVE mg/dL
Leukocytes,Ua: NEGATIVE
Nitrite: NEGATIVE
Protein, ur: NEGATIVE mg/dL
Specific Gravity, Urine: 1.025 (ref 1.005–1.030)
pH: 6 (ref 5.0–8.0)

## 2022-05-05 LAB — CBC
HCT: 47 % (ref 39.0–52.0)
Hemoglobin: 15.9 g/dL (ref 13.0–17.0)
MCH: 28.1 pg (ref 26.0–34.0)
MCHC: 33.8 g/dL (ref 30.0–36.0)
MCV: 83.2 fL (ref 80.0–100.0)
Platelets: 243 10*3/uL (ref 150–400)
RBC: 5.65 MIL/uL (ref 4.22–5.81)
RDW: 13 % (ref 11.5–15.5)
WBC: 10.3 10*3/uL (ref 4.0–10.5)
nRBC: 0 % (ref 0.0–0.2)

## 2022-05-05 LAB — LIPASE, BLOOD: Lipase: 31 U/L (ref 11–51)

## 2022-05-05 LAB — TROPONIN I (HIGH SENSITIVITY): Troponin I (High Sensitivity): 4 ng/L (ref <18)

## 2022-05-05 NOTE — ED Triage Notes (Signed)
Pt sts that he has been getting food stuck in his esophagus for the last 15 days with dark and light green stool. Pt has been having abd pain with this also.

## 2022-05-05 NOTE — ED Notes (Signed)
EDP at bedside  

## 2022-05-05 NOTE — ED Provider Notes (Signed)
[facility] Provider Note    Event Date/Time   First MD Initiated Contact with Patient 05/05/22 2310     (approximate)   History   Abdominal Pain   HPI  Kenneth Dennis is a 41 y.o. male without significant past medical history presents ground by family for evaluation of 2 weeks of some intermittent right upper quadrant abdominal pain precipitated aggravated by oral intake.  He states he is currently pain-free.  States he has some greenish diarrhea last week but not since then.  States he also had some burning with urination about a week ago but not since then.  No recent travel outside [place].  States the pain radiates up into his substernal area described as burning.  He denies any shortness of breath, cough, fever, back pain, headache earache or sore throat.  He has no rashes.  He has tried some Tylenol for symptoms but no other medications.  He does not smoke or use illegal drugs.  No prior similar episodes.  No other clear alleviating or aggravating factors      Physical Exam  Triage Vital Signs: ED Triage Vitals  Enc Vitals Group     BP 05/05/22 1740 131/77     Pulse Rate 05/05/22 1740 66     Resp 05/05/22 1740 14     Temp 05/05/22 1740 98.5 F (36.9 C)     Temp Source 05/05/22 1740 Oral     SpO2 05/05/22 1740 99 %     Weight 05/05/22 1741 153 lb (69.4 kg)     Height 05/05/22 1741 '5\' 3"'$  (1.6 m)     Head Circumference --      Peak Flow --      Pain Score 05/05/22 1745 2     Pain Loc --      Pain Edu? --      Excl. in [place]? --     Most recent vital signs: Vitals:   05/06/22 0000 05/06/22 0030  BP: 124/77 118/83  Pulse: 61 (!) 58  Resp: 12 12  Temp:    SpO2: 99% 100%    General: Awake, no distress.  CV:  Good peripheral perfusion.  2+ radial pulse.  No murmur Resp:  Normal effort.  Clear bilaterally. Abd:  No distention.  Soft throughout.  No CVA tenderness Other:     ED Results / Procedures / Treatments  Labs (all  labs ordered are listed, but only abnormal results are displayed) Labs Reviewed  COMPREHENSIVE METABOLIC PANEL - Abnormal; Notable for the following components:      Result Value   Glucose, Bld 101 (*)    All other components within normal limits  URINALYSIS, ROUTINE W REFLEX MICROSCOPIC - Abnormal; Notable for the following components:   Color, Urine YELLOW (*)    APPearance CLEAR (*)    All other components within normal limits  GASTROINTESTINAL PANEL BY PCR, STOOL (REPLACES STOOL CULTURE)  LIPASE, BLOOD  CBC  H. PYLORI ANTIGEN, STOOL  TROPONIN I (HIGH SENSITIVITY)     EKG  EKG shows sinus rhythm with appears to be some benign early repull in V2 without any other ST abnormalities or evidence of acute ischemia or significant arrhythmia.  Normal axis and intervals.  RADIOLOGY  Right upper quadrant ultrasound interpretation without evidence of cholelithiasis or dilation, bile duct.  I reviewed radiology dictation and agree with their findings of hepatic steatosis and findings consistent with possible adenomyomatosis without evidence of cholelithiasis or acute cholecystitis  and not dilation of the common bile duct at 3 mm.   PROCEDURES:  Critical Care performed: No  Procedures    MEDICATIONS ORDERED IN ED: Medications - No data to display   IMPRESSION / MDM / [place] / ED COURSE  I reviewed the triage vital signs and the nursing notes. Patient's presentation is most consistent with acute presentation with potential threat to life or bodily function.                               Differential diagnosis includes, but is not limited to symptomatic cholelithiasis, peptic ulcer disease, pancreatitis, kidney stone, cystitis, gastritis, possible angina but overall lower suspicion based on his history and exam for appendicitis, pyelonephritis, AAA or other immediate life-threatening process at this time.    CMP shows no significant likely metabolic derangements.   There is no evidence of acute hepatitis or cholestatic process.  Lipase is WNL and not suggestive of pancreatitis.  UA does not appear infected.  CBC without leukocytosis or acute anemia.  GI pathogen panel ordered in triage is negative.  EKG and nonelevated troponin not suggestive of ACS.  Right upper quadrant ultrasound interpretation without evidence of cholelithiasis or dilation, bile duct.  I reviewed radiology dictation and agree with their findings of hepatic steatosis and findings consistent with possible adenomyomatosis without evidence of cholelithiasis or acute cholecystitis and not dilation of the common bile duct at 3 mm.  I suspect possibly duodenitis or gastritis or peptic ulcer disease.  Will start patient on Protonix.  Advised of pending pylori test that was sent and he will need to follow this up with his PCP.  Discussed returning for any new or worsening symptoms.  At this point I have low suspicion for immediately life-threatening Process and I believe patient is stable for continued outpatient evaluation.  Advised hepatic steatosis seen on ultrasound.  Counseled on EtOH cessation.Marland Kitchen  Discharged in stable condition.  Strict return precautions advised and discussed.  Rx for Protonix and Carafate given.     FINAL CLINICAL IMPRESSION(S) / ED DIAGNOSES   Final diagnoses:  RUQ pain  Hepatic steatosis  Adenomyomatosis of gallbladder     Rx / DC Orders   ED Discharge Orders          Ordered    pantoprazole (PROTONIX) 40 MG tablet  Daily        05/06/22 0045    sucralfate (CARAFATE) 1 g tablet  4 times daily        05/06/22 0045             Note:  This document was prepared using Dragon voice recognition software and may include unintentional dictation errors.   Lucrezia Starch, MD 05/06/22 [phone]

## 2022-05-06 MED ORDER — SUCRALFATE 1 G PO TABS: 1.0000 g | ORAL_TABLET | Freq: Four times a day (QID) | ORAL | 0 refills | Status: AC

## 2022-05-06 MED ORDER — PANTOPRAZOLE SODIUM 40 MG PO TBEC: 40.0000 mg | DELAYED_RELEASE_TABLET | Freq: Every day | ORAL | 0 refills | Status: AC

## 2022-05-06 NOTE — Discharge Instructions (Addendum)
Your Ultrasound today showed: IMPRESSION: Hepatic steatosis.   Ring down artifact in the gallbladder fundus compatible with adenomyomatosis. No cholelithiasis or acute cholecystitis.

## 2022-05-06 NOTE — ED Notes (Signed)
US at bedside

## 2022-05-08 LAB — H. PYLORI ANTIGEN, STOOL: H. Pylori Stool Ag, Eia: NEGATIVE
